# Patient Record
Sex: Female | Born: 1970 | Race: White | Hispanic: No | Marital: Married
Health system: Midwestern US, Community
[De-identification: ages and names within clinical notes are randomized; demographics above are authoritative.]

## PROBLEM LIST (undated history)

## (undated) HISTORY — PX: KNEE SURGERY: SHX244

## (undated) HISTORY — PX: ELBOW SURGERY: SHX618

## (undated) HISTORY — PX: ABDOMINAL HYSTERECTOMY: SHX81

## (undated) HISTORY — PX: TYMPANOSTOMY: SHX2586

---

## 2004-09-02 ENCOUNTER — Ambulatory Visit (HOSPITAL_BASED_OUTPATIENT_CLINIC_OR_DEPARTMENT_OTHER): Admission: RE | Admit: 2004-09-02 | Discharge: 2004-09-02 | Payer: Self-pay | Admitting: Orthopedic Surgery

## 2004-09-03 ENCOUNTER — Encounter: Payer: Self-pay | Admitting: Orthopedic Surgery

## 2004-10-03 ENCOUNTER — Encounter: Payer: Self-pay | Admitting: Orthopedic Surgery

## 2004-11-03 ENCOUNTER — Encounter: Payer: Self-pay | Admitting: Orthopedic Surgery

## 2005-01-17 ENCOUNTER — Emergency Department: Payer: Self-pay | Admitting: Emergency Medicine

## 2005-09-14 ENCOUNTER — Other Ambulatory Visit: Payer: Self-pay

## 2005-09-14 ENCOUNTER — Emergency Department: Payer: Self-pay | Admitting: Unknown Physician Specialty

## 2006-10-23 ENCOUNTER — Ambulatory Visit: Payer: Self-pay | Admitting: Unknown Physician Specialty

## 2007-04-24 ENCOUNTER — Emergency Department: Payer: Self-pay

## 2007-04-26 ENCOUNTER — Ambulatory Visit: Payer: Self-pay | Admitting: Orthopaedic Surgery

## 2007-04-30 ENCOUNTER — Ambulatory Visit: Payer: Self-pay | Admitting: Orthopaedic Surgery

## 2007-05-06 ENCOUNTER — Ambulatory Visit: Payer: Self-pay | Admitting: Orthopaedic Surgery

## 2007-08-24 ENCOUNTER — Ambulatory Visit: Payer: Self-pay | Admitting: Unknown Physician Specialty

## 2008-03-08 ENCOUNTER — Other Ambulatory Visit: Payer: Self-pay

## 2008-03-08 ENCOUNTER — Emergency Department: Payer: Self-pay | Admitting: Internal Medicine

## 2008-04-10 ENCOUNTER — Emergency Department: Payer: Self-pay | Admitting: Emergency Medicine

## 2008-06-23 IMAGING — CR RIGHT FOOT COMPLETE - 3+ VIEW
1 series · 3 of 3 positions shown · non-contrast
Comparison: none

REASON FOR EXAM: trauma WR
COMMENTS:

PROCEDURE:     DXR - DXR FOOT RT COMPLETE W/OBLIQUES  - April 10, 2008  [DATE]
RESULT:     Three views of the RIGHT foot reveal no evidence of an acute
fracture. There is a large plantar calcaneal spur. Attention to the
symptomatic metatarsal region reveals no acute fracture.

[Series 1: view not recorded · 0.17mm/px · 3 of 3 slices shown]
[im 1/3]
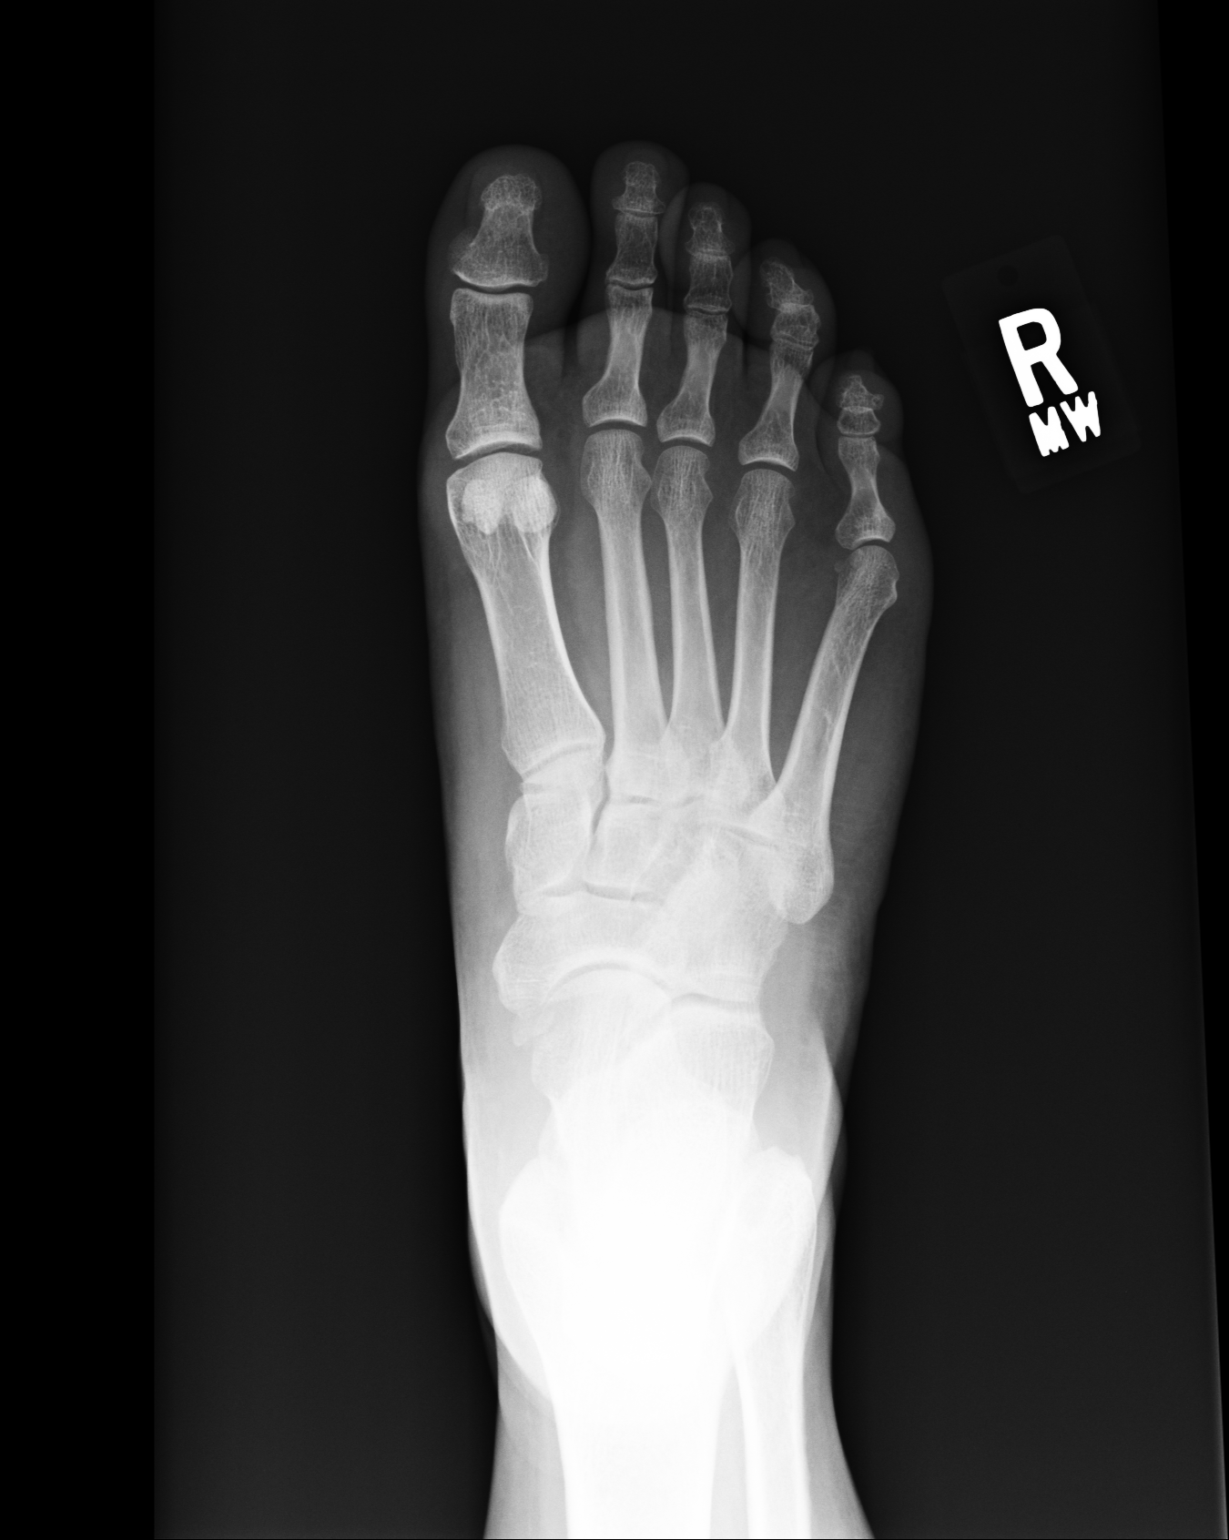
[im 2/3]
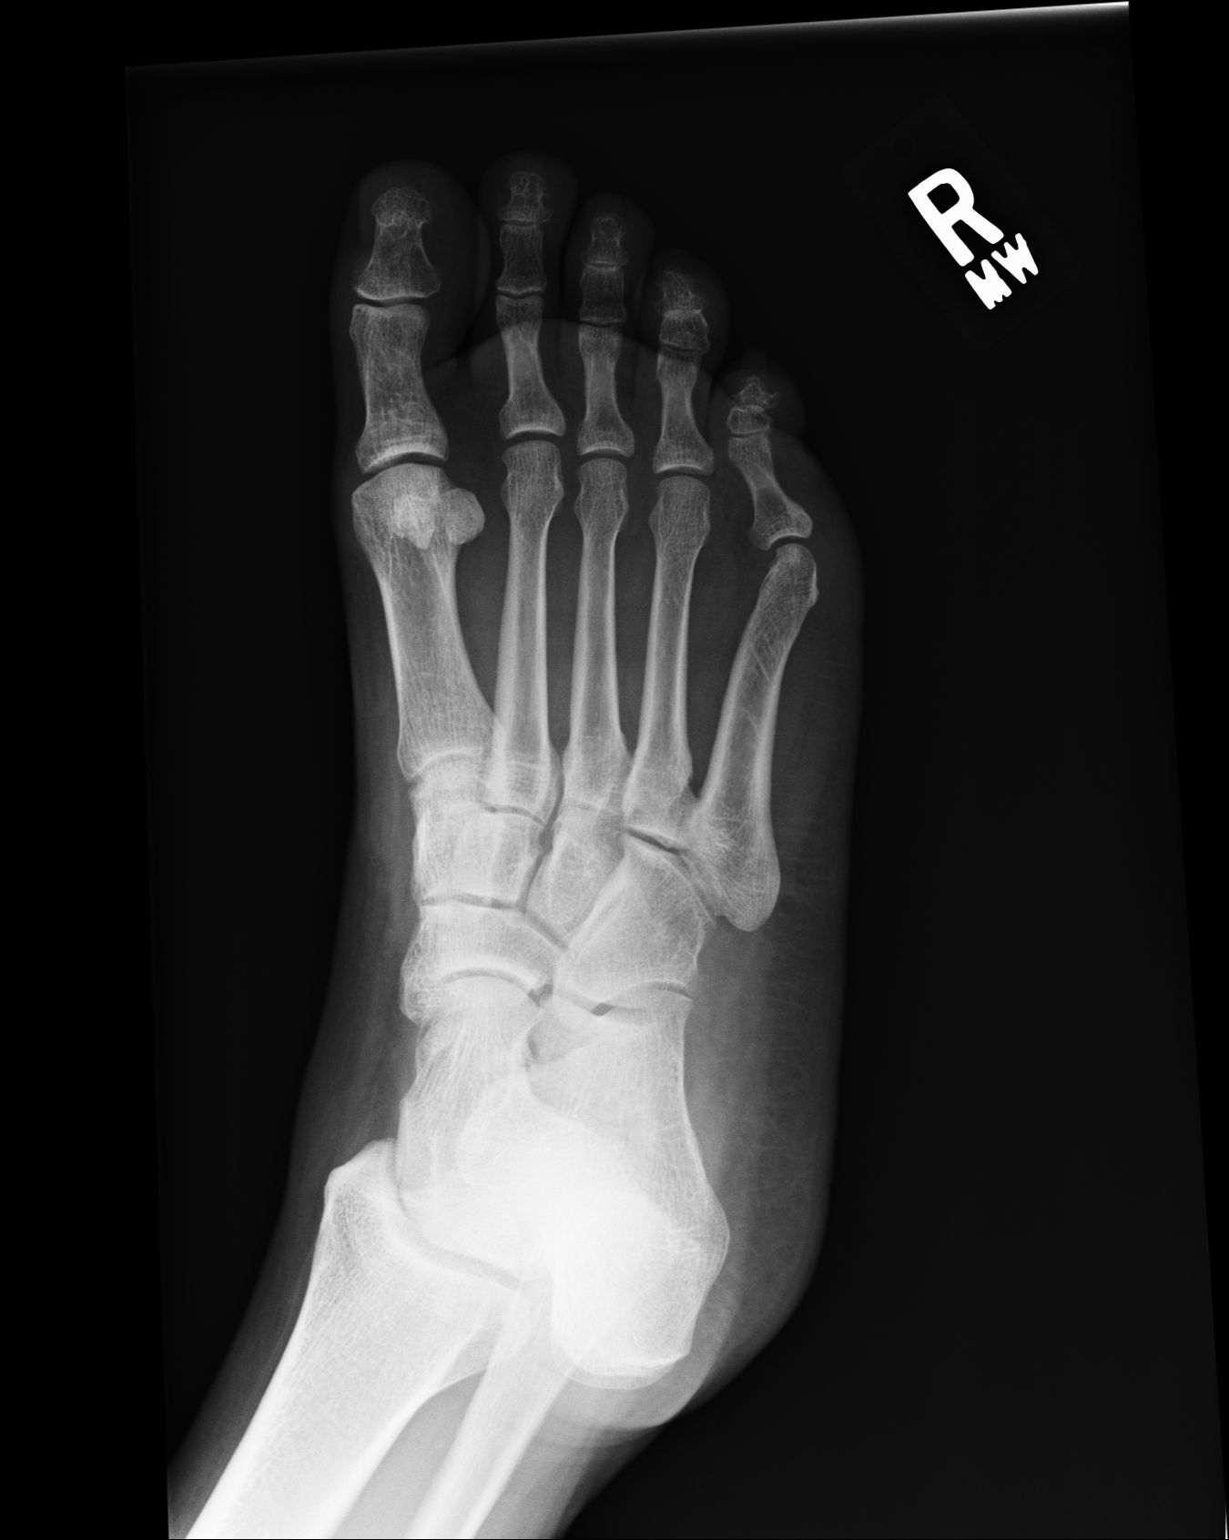
[im 3/3]
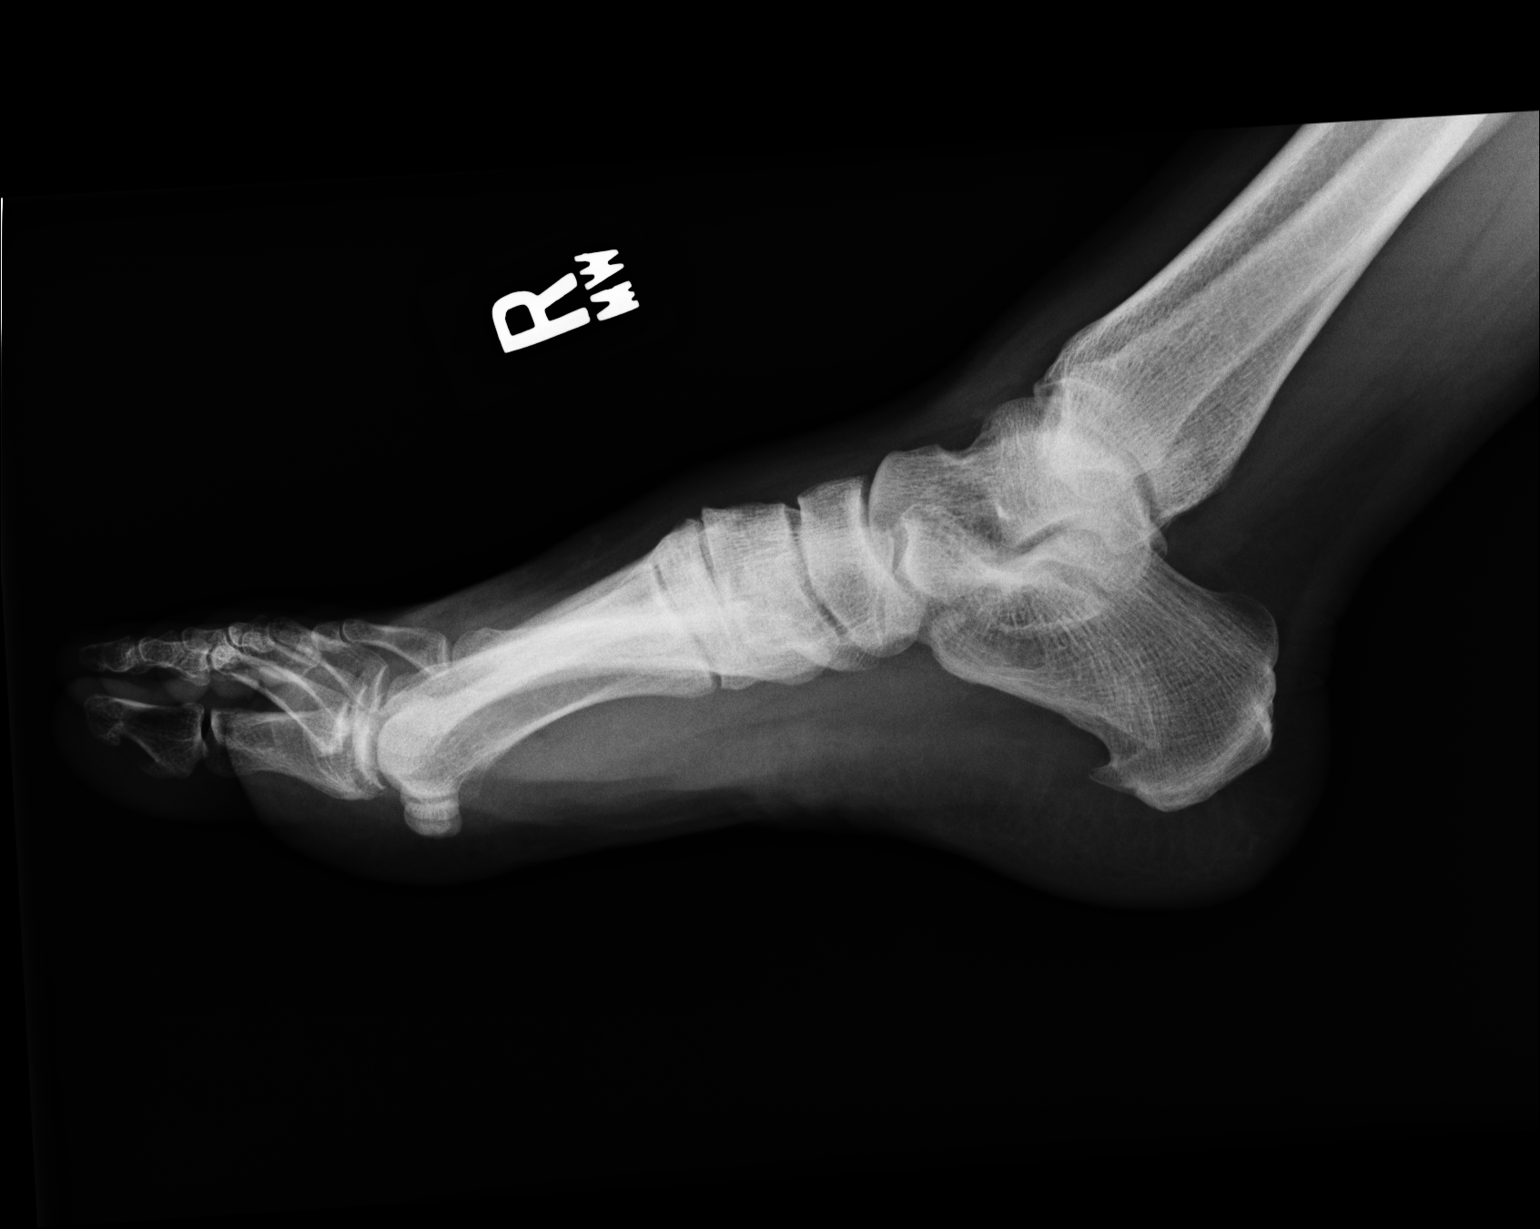

[3 of 3 positions shown; findings below may reference images not displayed]

IMPRESSION: I do not see evidence of an acute fracture of the bones of the RIGHT foot.

## 2009-03-29 ENCOUNTER — Emergency Department: Payer: Self-pay | Admitting: Unknown Physician Specialty

## 2009-06-11 IMAGING — CR DG CHEST 2V
1 series · 2 of 2 positions shown · non-contrast
Comparison: none

REASON FOR EXAM: cough
COMMENTS:   LMP: Post Hysterectomy

PROCEDURE:     DXR - DXR CHEST PA (OR AP) AND LATERAL  - March 29, 2009  [DATE]
RESULT:     The lung fields are clear. No pneumonia, pneumothorax or pleural
effusion is seen. The heart size is normal. The mediastinal and osseous
structures reveal no significant abnormalities.

[Series 1: view not recorded · 0.17mm/px · 2 of 2 slices shown]
[im 1/2]
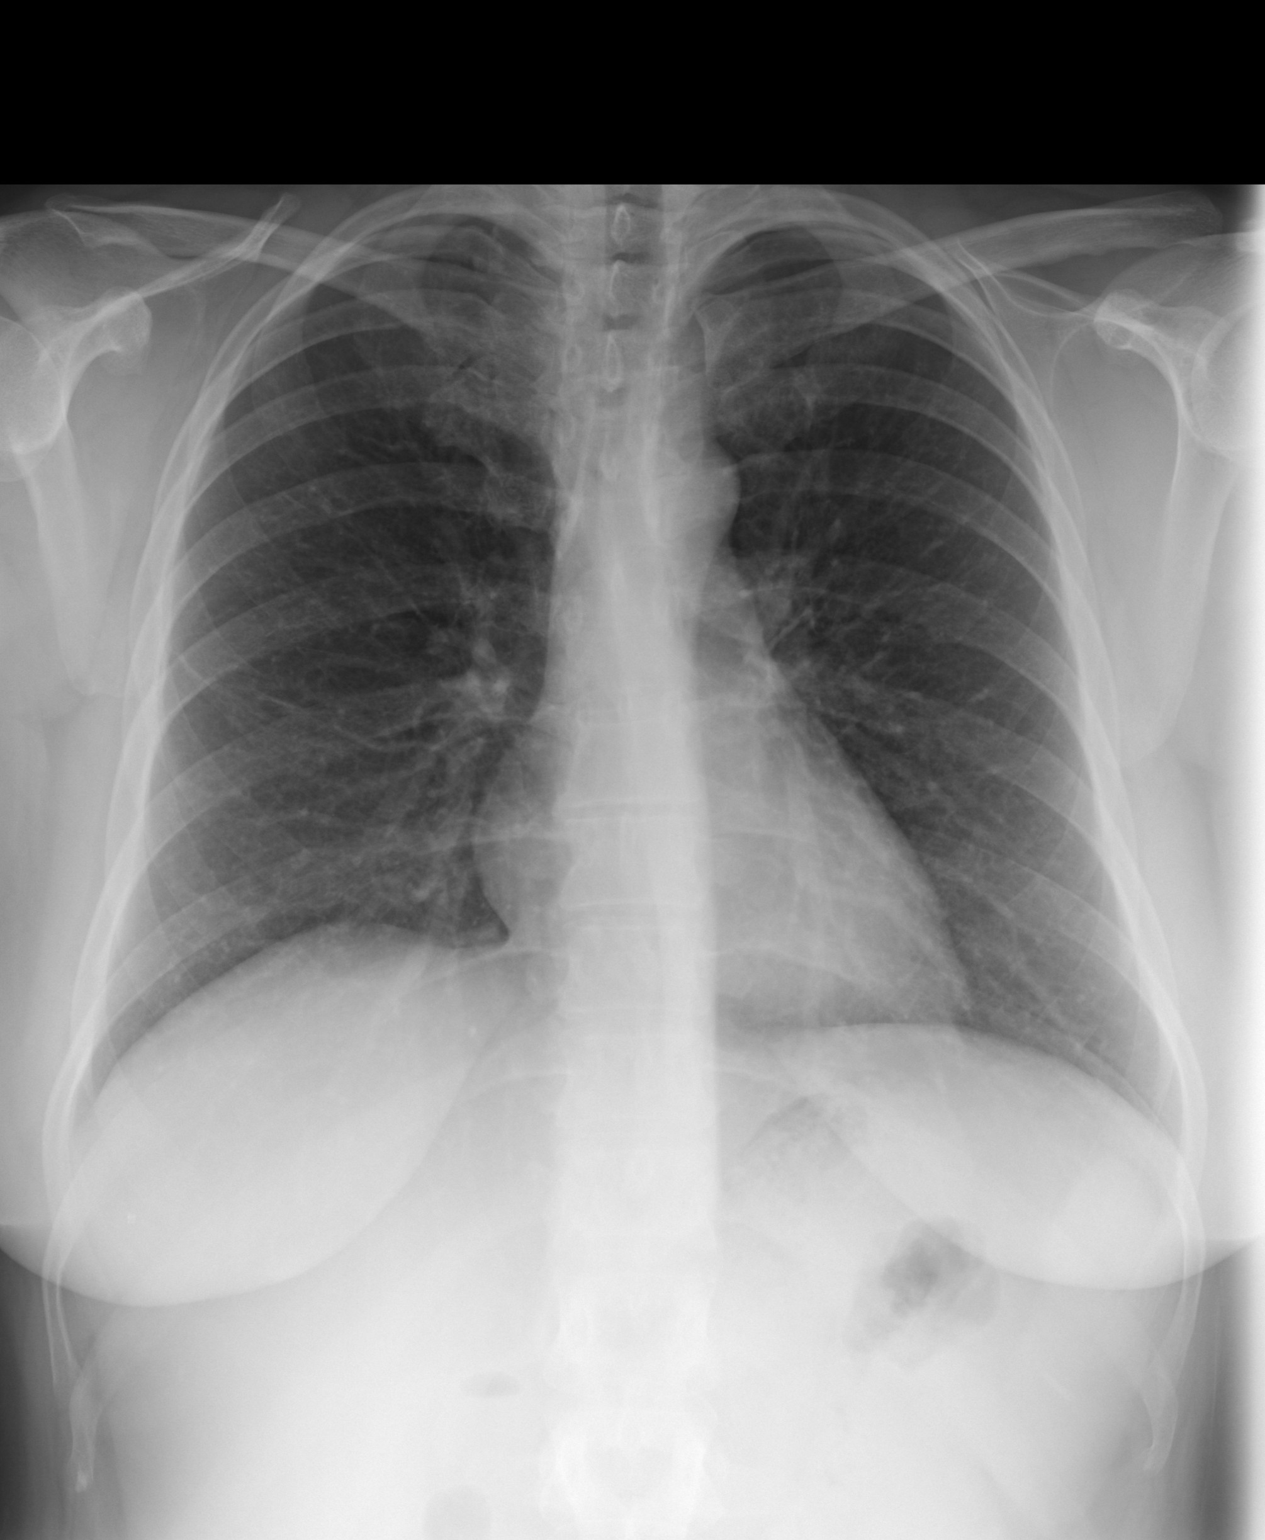
[im 2/2]
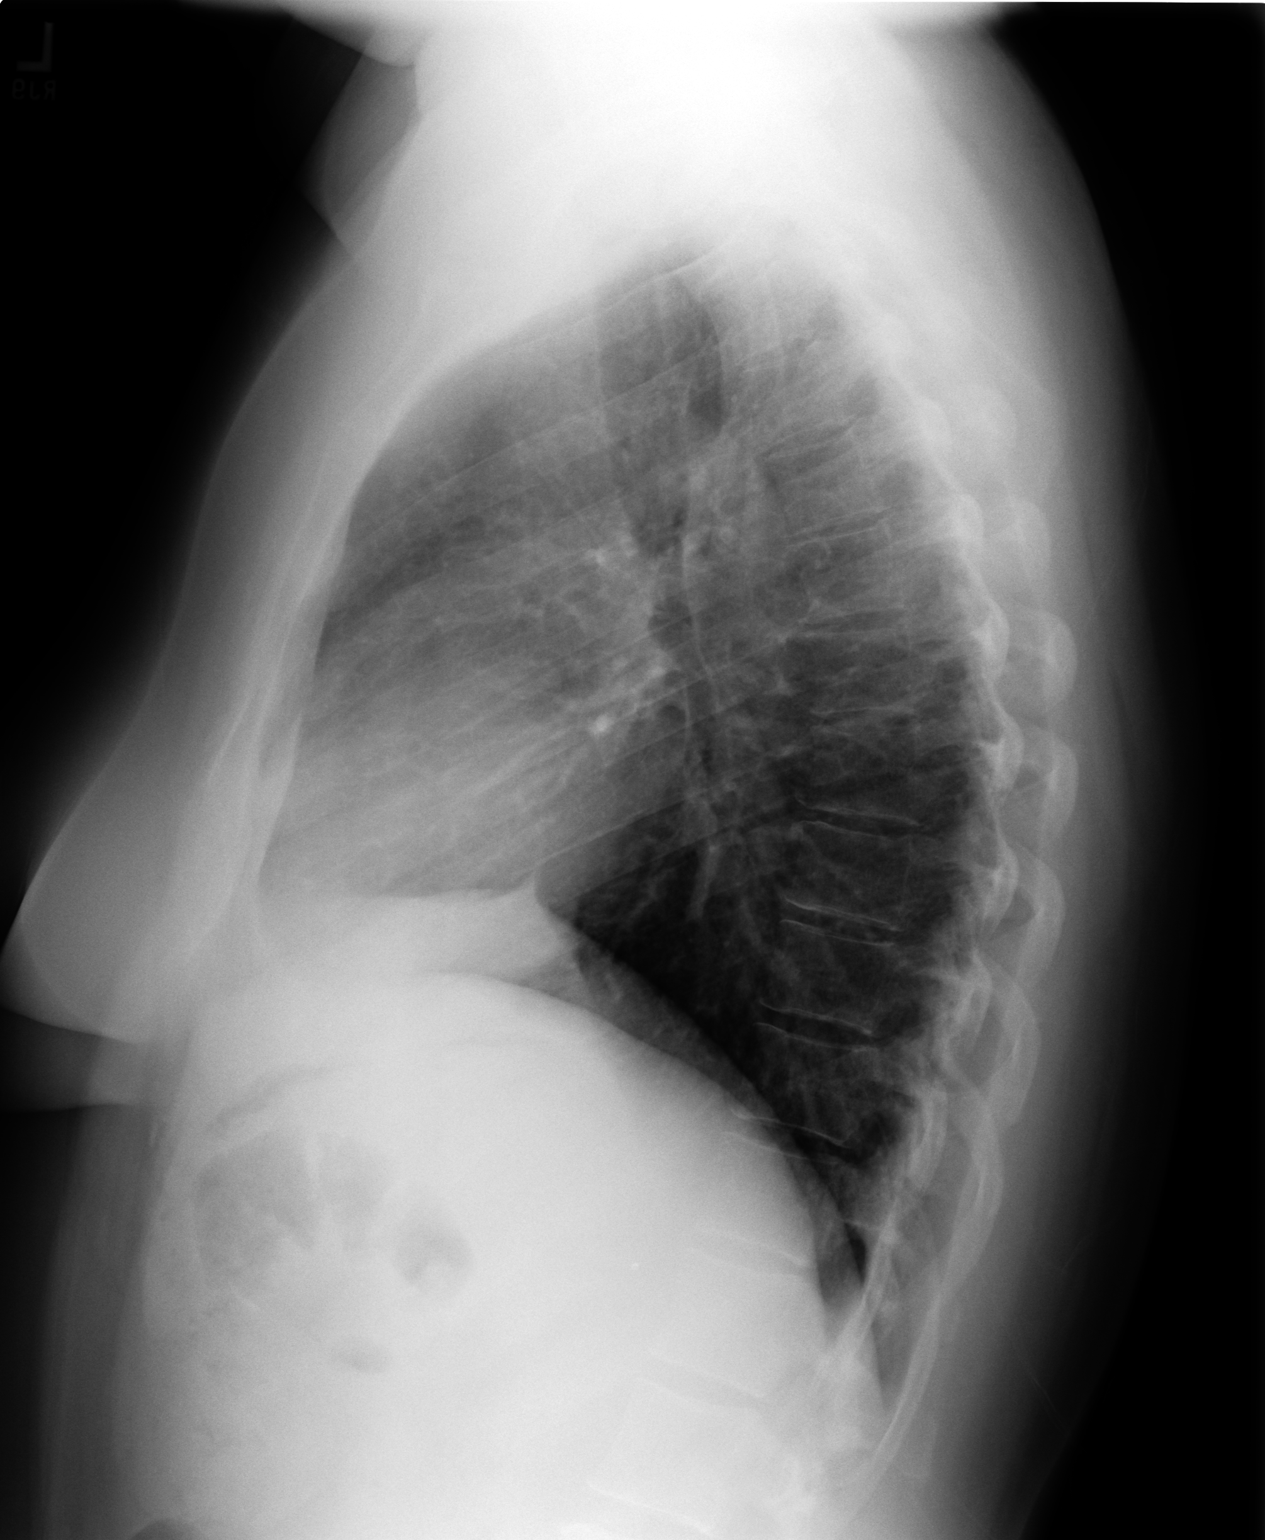

[2 of 2 positions shown; findings below may reference images not displayed]

IMPRESSION: No acute changes are identified.

## 2016-01-21 ENCOUNTER — Ambulatory Visit: Admit: 2016-01-28 | Discharge: 2016-01-28

## 2016-01-21 NOTE — Progress Notes (Signed)
Gina SpangleGeneva Harmening participated in an educational session on the importance of starting to make healthy choices prior to weight loss surgery. General healthy foods were reviewed. Diet history was reviewed. Patient set a dietary, exercise, and behavioral goal in order to start making healthy changes now.     Visit Vitals   ??? Ht 5\' 4"  (1.626 m)   ??? Wt 105.7 kg (233 lb)   ??? BMI 40.0 kg/m2     Hilliard ClarkLeah E Beiermann, RD

## 2016-02-25 ENCOUNTER — Ambulatory Visit: Attending: Specialist

## 2016-02-25 ENCOUNTER — Encounter

## 2016-02-26 NOTE — Progress Notes (Signed)
A user error has taken place: encounter opened in error, closed for administrative reasons.

## 2016-07-18 ENCOUNTER — Emergency Department
Admission: EM | Admit: 2016-07-18 | Discharge: 2016-07-18 | Disposition: A | Discharge status: Home / Self Care | Payer: BLUE CROSS/BLUE SHIELD | Attending: Emergency Medicine | Admitting: Emergency Medicine

## 2016-07-18 ENCOUNTER — Emergency Department: Payer: BLUE CROSS/BLUE SHIELD

## 2016-07-18 ENCOUNTER — Encounter: Payer: Self-pay | Admitting: Emergency Medicine

## 2016-07-18 DIAGNOSIS — M7122 Synovial cyst of popliteal space [Baker], left knee: Secondary | ICD-10-CM

## 2016-07-18 DIAGNOSIS — F17200 Nicotine dependence, unspecified, uncomplicated: Secondary | ICD-10-CM | POA: Diagnosis not present

## 2016-07-18 DIAGNOSIS — M25562 Pain in left knee: Secondary | ICD-10-CM | POA: Diagnosis present

## 2016-07-18 DIAGNOSIS — M79605 Pain in left leg: Secondary | ICD-10-CM

## 2016-07-18 MED ORDER — OXYCODONE-ACETAMINOPHEN 5-325 MG PO TABS: 1.0000 | ORAL_TABLET | Freq: Four times a day (QID) | ORAL | 0 refills | Status: AC | PRN

## 2016-07-18 NOTE — ED Triage Notes (Addendum)
Pt reports pain to left calf and knee starting yesterday. Is a smoker. Swelling noted to left calf. Denies CP/SHOB

## 2016-07-18 NOTE — ED Notes (Signed)
Called for pt to bring back to CDU, no response, First Nurse informed

## 2016-07-18 NOTE — ED Provider Notes (Signed)
[facility] Emergency Department Provider Note   ____________________________________________   First MD Initiated Contact with Patient 07/18/16 2037     (approximate)  I have reviewed the triage vital signs and the nursing notes.   HISTORY  Chief Complaint Leg Pain    HPI Andrea Mejia is a 45 y.o. female reports that yesterday she began experiencing pain on the left knee. No redness fever, but his nose that seems slightly tight and swollen on the left knee. No numbness to her weakness. No nausea or vomiting.  Reports that she was able to set up and has a follow-up appointment with orthopedics on Monday. She has a history of arthritis as well as 2 previous laparoscopies involving the left leg last year.  She reports she is currently not taking any prescription pain medicine at home. She is a smoker. No chest pain or shortness of breath. No trouble walking, just pain behind the left knee she reports as moderate to severe with walking  No trauma  No past medical history on file.  There are no active problems to display for this patient.   Past Surgical History:  Procedure Laterality Date  . ABDOMINAL HYSTERECTOMY    . ELBOW SURGERY    . KNEE SURGERY    . TYMPANOSTOMY      Prior to Admission medications   Medication Sig Start Date End Date Taking? Authorizing Provider  oxyCODONE-acetaminophen (ROXICET) 5-325 MG tablet Take 1 tablet by mouth every 6 (six) hours as needed for severe pain. 07/18/16   Sharyn Creamer, MD    Allergies Vicodin [hydrocodone-acetaminophen] States she can take Percocet without issue. No family history on file.  Social History Social History  Substance Use Topics  . Smoking status: Current Every Day Smoker  . Smokeless tobacco: Not on file  . Alcohol use Yes    Review of Systems Constitutional: No fever/chills Eyes: No visual changes. ENT: No sore throat. Cardiovascular: Denies chest pain. Respiratory: Denies  shortness of breath. Gastrointestinal: No abdominal pain.  No nausea, no vomiting.  No diarrhea.  No constipation. Genitourinary: Negative for dysuria. Musculoskeletal: Negative for back pain. No foot or ankle pain. No pain in the hips. Skin: Negative for rash. Neurological: Negative for headaches, focal weakness or numbness.  10-point ROS otherwise negative.  ____________________________________________   PHYSICAL EXAM:  VITAL SIGNS: ED Triage Vitals  Enc Vitals Group     BP 07/18/16 1848 (!) 151/99     Pulse Rate 07/18/16 1848 (!) 102     Resp 07/18/16 1848 18     Temp 07/18/16 1848 98.9 F (37.2 C)     Temp Source 07/18/16 1848 Oral     SpO2 07/18/16 1848 100 %     Weight 07/18/16 1848 220 lb (99.8 kg)     Height 07/18/16 1848 5\' 5"  (1.651 m)     Head Circumference --      Peak Flow --      Pain Score 07/18/16 1849 6     Pain Loc --      Pain Edu? --      Excl. in GC? --     Constitutional: Alert and oriented. Well appearing and in no acute distress. Eyes: Conjunctivae are normal. PERRL. EOMI. Head: Atraumatic. Nose: No congestion/rhinnorhea. Mouth/Throat: Mucous membranes are moist.  Oropharynx non-erythematous. Neck: No stridor.   Cardiovascular: Normal rate, regular rhythm. Grossly normal heart sounds.  Good peripheral circulation.Heart rate approximately 80 by palpation. Respiratory: Normal respiratory effort.  No  retractions. Lungs CTAB. Gastrointestinal: Soft and nontender. No distention. No abdominal bruits. No CVA tenderness. Musculoskeletal:  Lower Extremities  No edema. Normal DP/PT pulses bilateral with good cap refill.  Normal neuro-motor function lower extremities bilateral.  RIGHT Right lower extremity demonstrates normal strength, good use of all muscles. No edema bruising or contusions of the right hip, right knee, right ankle. Full range of motion of the right lower extremity without pain. No pain on axial loading. No evidence of  trauma.  LEFT Left lower extremity demonstrates normal strength, good use of all muscles. No edema bruising or contusions of the hip, ankle. Full range of motion of the left lower extremity reports mild to moderate pain behind the left knee with flexion and extension. No pain on axial loading. No evidence of trauma. There is no erythema, no evidence of acute infusion, no warmth of the left knee. The popliteal fossa demonstrates a small area of soft, nontender induration without erythema overlying.  Neurologic:  Normal speech and language. No gross focal neurologic deficits are appreciated. Skin:  Skin is warm, dry and intact. No rash noted. Psychiatric: Mood and affect are normal. Speech and behavior are normal.  ____________________________________________   LABS (all labs ordered are listed, but only abnormal results are displayed)  Labs Reviewed - No data to display ____________________________________________  EKG   ____________________________________________  RADIOLOGY  [place] Venous Img Lower Unilateral Left  Result Date: 07/18/2016 CLINICAL DATA:  Acute onset of left calf and knee pain and swelling. Initial encounter. EXAM: LEFT LOWER EXTREMITY VENOUS DOPPLER ULTRASOUND TECHNIQUE: Gray-scale sonography with graded compression, as well as color Doppler and duplex ultrasound were performed to evaluate the lower extremity deep venous systems from the level of the common femoral vein and including the common femoral, femoral, profunda femoral, popliteal and calf veins including the posterior tibial, peroneal and gastrocnemius veins when visible. The superficial great saphenous vein was also interrogated. Spectral Doppler was utilized to evaluate flow at rest and with distal augmentation maneuvers in the common femoral, femoral and popliteal veins. COMPARISON:  None. FINDINGS: Contralateral Common Femoral Vein: Respiratory phasicity is normal and symmetric with the symptomatic side. No  evidence of thrombus. Normal compressibility. Common Femoral Vein: No evidence of thrombus. Normal compressibility, respiratory phasicity and response to augmentation. Saphenofemoral Junction: No evidence of thrombus. Normal compressibility and flow on color Doppler imaging. Profunda Femoral Vein: No evidence of thrombus. Normal compressibility and flow on color Doppler imaging. Femoral Vein: No evidence of thrombus. Normal compressibility, respiratory phasicity and response to augmentation. Popliteal Vein: No evidence of thrombus. Normal compressibility, respiratory phasicity and response to augmentation. Calf Veins: No evidence of thrombus. Normal compressibility and flow on color Doppler imaging. Superficial Great Saphenous Vein: No evidence of thrombus. Normal compressibility and flow on color Doppler imaging. Venous Reflux:  None. Other Findings: There appears to be a Baker's cyst at the left popliteal fossa, measuring 4.2 x 0.9 x 2.1 cm. IMPRESSION: 1. No evidence of deep venous thrombosis. 2. 4.2 cm Baker's cyst noted at the left popliteal fossa. Electronically Signed   By: Roanna RaiderJeffery  Chang M.D.   On: 07/18/2016 20:33    ____________________________________________   PROCEDURES  Procedure(s) performed: None  Procedures  Critical Care performed: No  ____________________________________________   INITIAL IMPRESSION / ASSESSMENT AND PLAN / ED COURSE  Pertinent labs & imaging results that were available during my care of the patient were reviewed by me and considered in my medical decision making (see chart for details).  Clinical Course  Value Comment By Time  US Venous Img Lower Unilateral Left (Reviewed) Sharyn Creamer, MD 09/15 2036    Clinical history, examination per consistent with a Baker's cyst without evidence of superinfection or complication. Discussed with the patient, she'll follow-up with orthopedics on Monday. I will prescribe the patient a narcotic pain medicine due to  their condition which I anticipate will cause at least moderate pain short term. I discussed with the patient safe use of narcotic pain medicines, and that they are not to drive, work in dangerous areas, or ever take more than prescribed (no more than 1 pill every 6 hours). We discussed that this is the type of medication that can be  overdosed on and the risks of this type of medicine. Patient is very agreeable to only use as prescribed and to never use more than prescribed.  Return precautions and treatment recommendations and follow-up discussed with the patient who is agreeable with the plan.  ____________________________________________   FINAL CLINICAL IMPRESSION(S) / ED DIAGNOSES  Final diagnoses:  Baker cyst, left      NEW MEDICATIONS STARTED DURING THIS VISIT:  New Prescriptions   OXYCODONE-ACETAMINOPHEN (ROXICET) 5-325 MG TABLET    Take 1 tablet by mouth every 6 (six) hours as needed for severe pain.     Note:  This document was prepared using Dragon voice recognition software and may include unintentional dictation errors.     Sharyn Creamer, MD 07/18/16 2055

## 2018-03-06 IMAGING — US US EXTREM LOW VENOUS*L*
1 series · 13 of 24 positions shown · non-contrast
Comparison: None.

CLINICAL DATA: Acute onset of left calf and knee pain and swelling.
Initial encounter.



[Series 1: us extrem low venous*left* · 0.08mm/px · 13 of 40 slices shown]
[im 1/40]
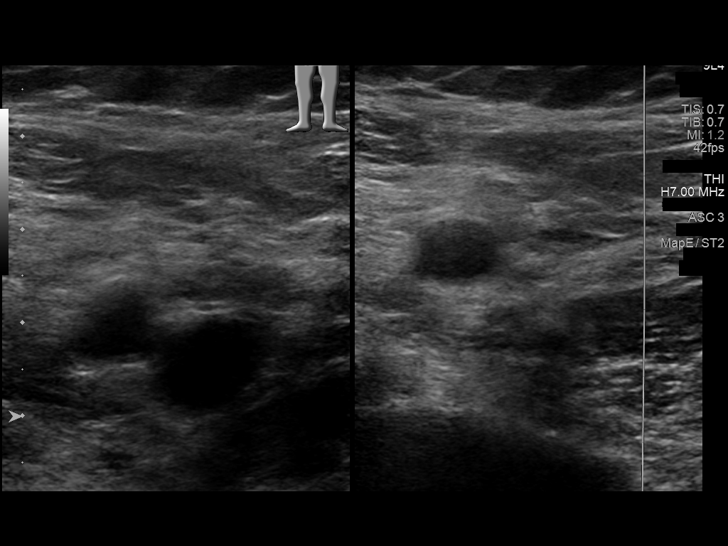
[im 4/40]
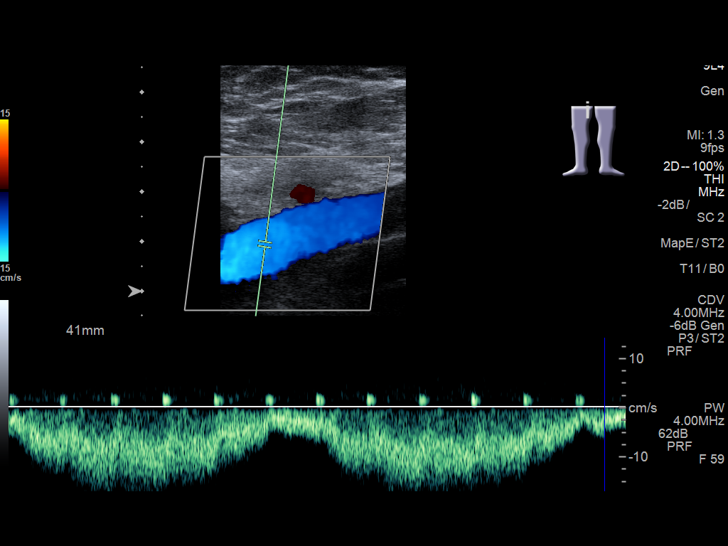
[im 7/40]
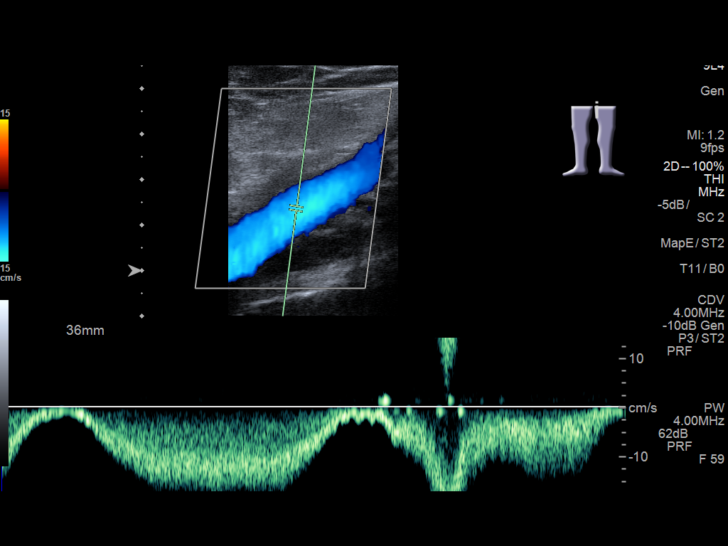
[im 11/40]
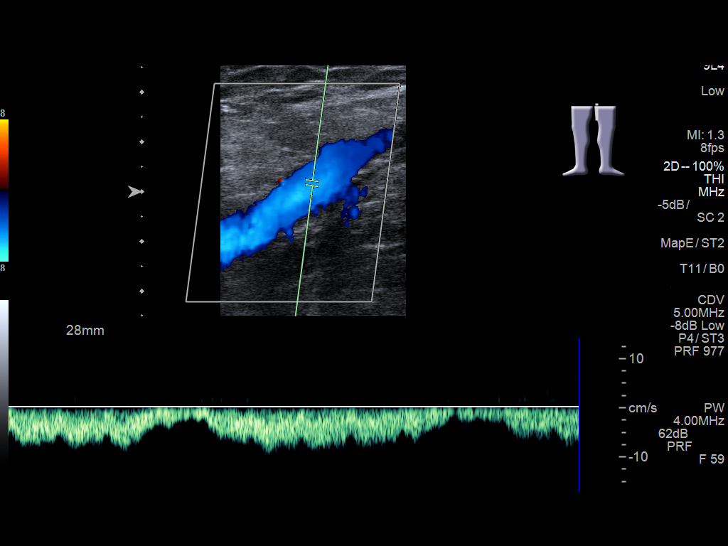
[im 14/40]
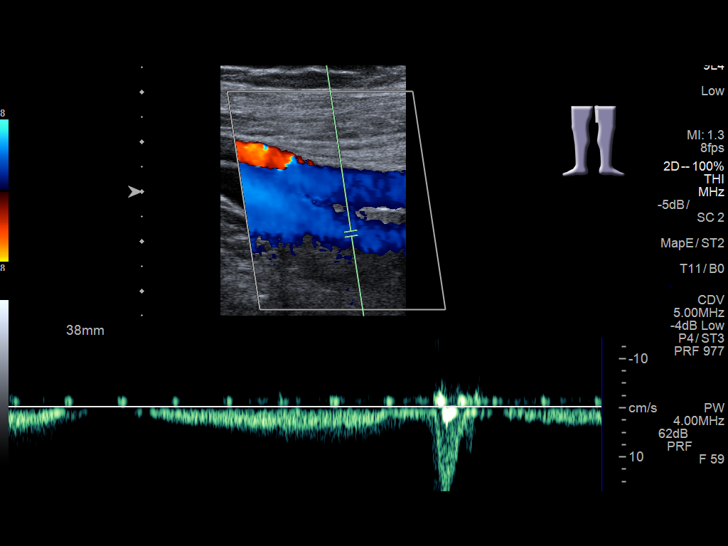
[im 17/40]
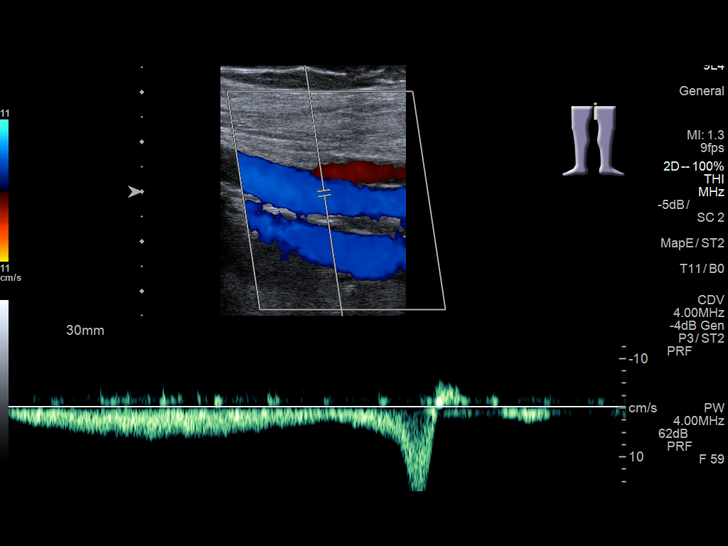
[im 21/40]
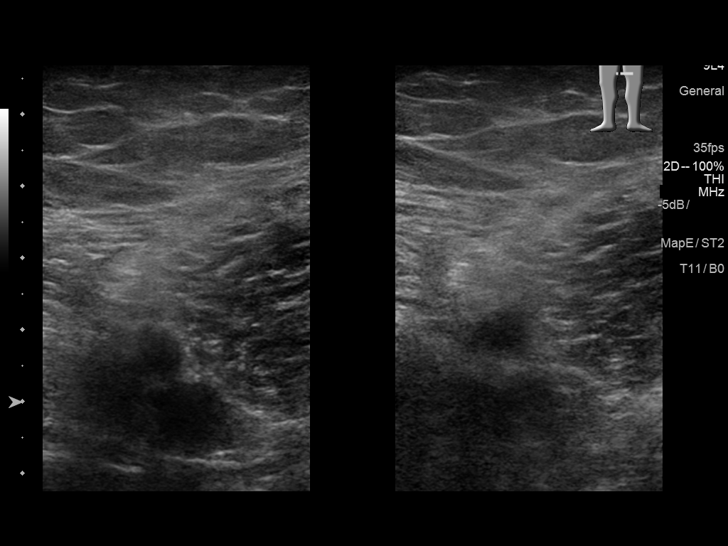
[im 23/40]
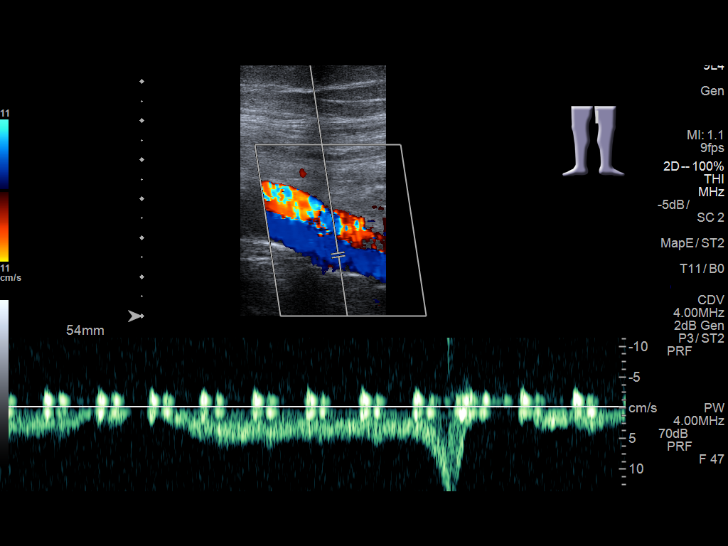
[im 26/40]
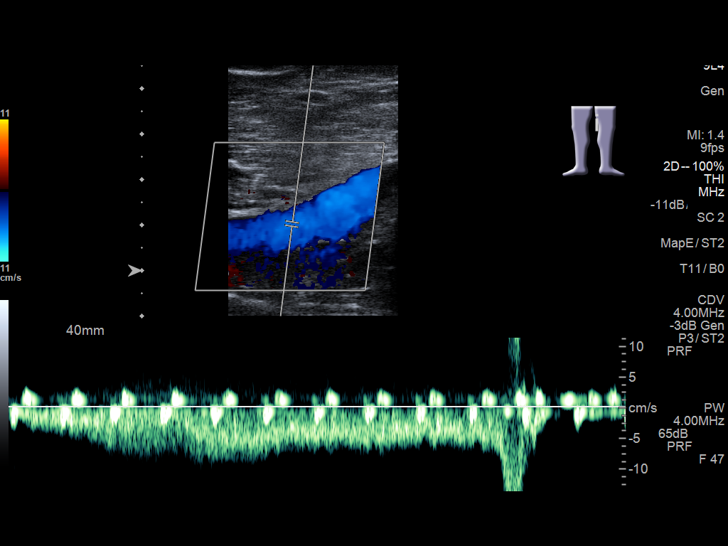
[im 29/40]
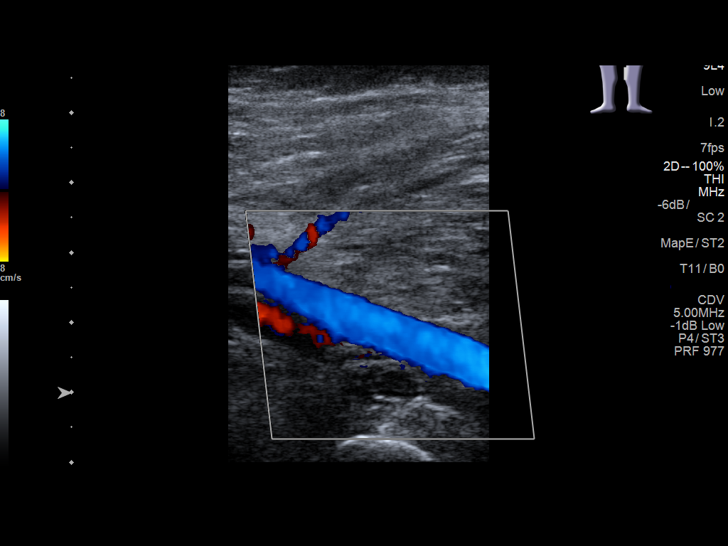
[im 33/40]
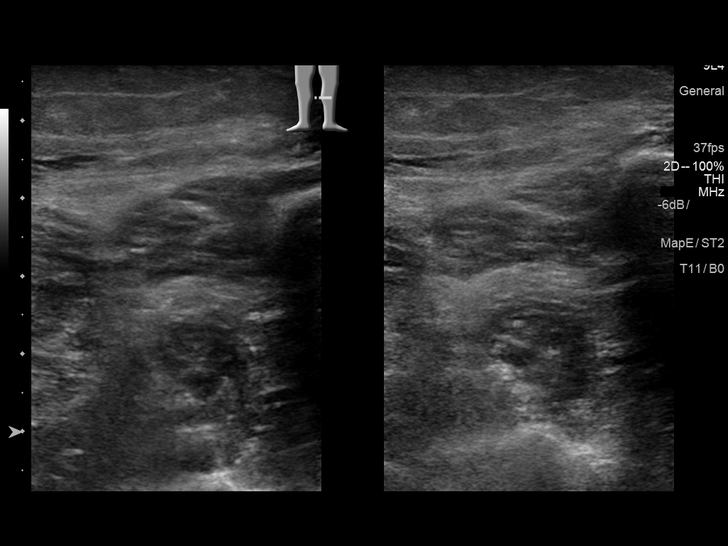
[im 36/40]
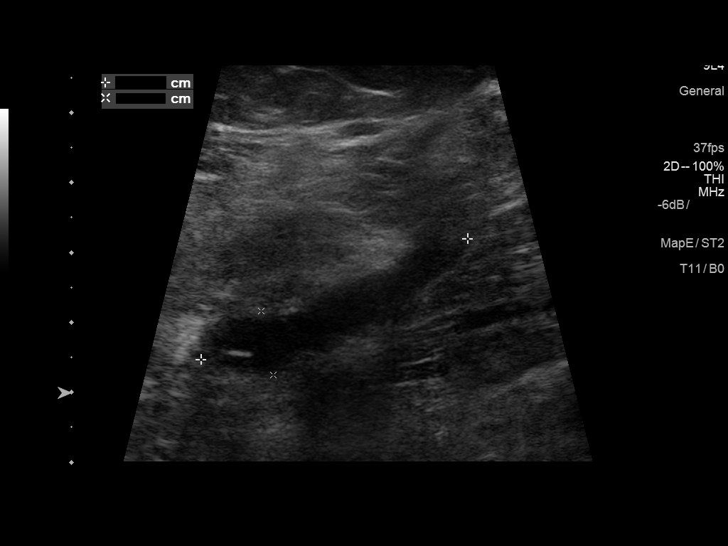
[im 40/40]
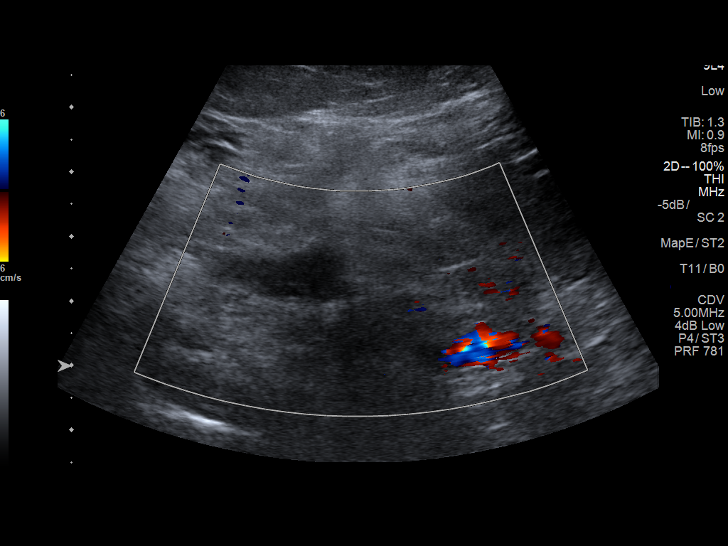

[13 of 24 positions shown; findings below may reference images not displayed]

FINDINGS: Contralateral Common Femoral Vein: Respiratory phasicity is normal
and symmetric with the symptomatic side. No evidence of thrombus.
Normal compressibility.

Common Femoral Vein: No evidence of thrombus. Normal
compressibility, respiratory phasicity and response to augmentation.

Saphenofemoral Junction: No evidence of thrombus. Normal
compressibility and flow on color Doppler imaging.

Profunda Femoral Vein: No evidence of thrombus. Normal
compressibility and flow on color Doppler imaging.

Femoral Vein: No evidence of thrombus. Normal compressibility,
respiratory phasicity and response to augmentation.

Popliteal Vein: No evidence of thrombus. Normal compressibility,
respiratory phasicity and response to augmentation.

Calf Veins: No evidence of thrombus. Normal compressibility and flow
on color Doppler imaging.

Superficial Great Saphenous Vein: No evidence of thrombus. Normal
compressibility and flow on color Doppler imaging.

Venous Reflux:  None.

Other Findings: There appears to be a Baker's cyst at the left
popliteal fossa, measuring 4.2 x 0.9 x 2.1 cm.
IMPRESSION: 1. No evidence of deep venous thrombosis.
2. 4.2 cm Baker's cyst noted at the left popliteal fossa.
